# Patient Record
Sex: Female | Born: 1945 | Race: White | Hispanic: No | Smoking: Never smoker
Health system: Southern US, Community
[De-identification: ages and names within clinical notes are randomized; demographics above are authoritative.]

## PROBLEM LIST (undated history)

## (undated) DIAGNOSIS — K219 Gastro-esophageal reflux disease without esophagitis: Secondary | ICD-10-CM

## (undated) DIAGNOSIS — C50919 Malignant neoplasm of unspecified site of unspecified female breast: Secondary | ICD-10-CM

## (undated) DIAGNOSIS — Z923 Personal history of irradiation: Secondary | ICD-10-CM

## (undated) DIAGNOSIS — Z803 Family history of malignant neoplasm of breast: Secondary | ICD-10-CM

## (undated) DIAGNOSIS — K760 Fatty (change of) liver, not elsewhere classified: Secondary | ICD-10-CM

## (undated) DIAGNOSIS — Z9221 Personal history of antineoplastic chemotherapy: Secondary | ICD-10-CM

## (undated) DIAGNOSIS — K7460 Unspecified cirrhosis of liver: Secondary | ICD-10-CM

## (undated) DIAGNOSIS — M1990 Unspecified osteoarthritis, unspecified site: Secondary | ICD-10-CM

## (undated) DIAGNOSIS — K449 Diaphragmatic hernia without obstruction or gangrene: Secondary | ICD-10-CM

## (undated) HISTORY — PX: COLONOSCOPY: SHX174

## (undated) HISTORY — DX: Gastro-esophageal reflux disease without esophagitis: K21.9

## (undated) HISTORY — DX: Malignant neoplasm of unspecified site of unspecified female breast: C50.919

## (undated) HISTORY — DX: Unspecified osteoarthritis, unspecified site: M19.90

## (undated) HISTORY — DX: Fatty (change of) liver, not elsewhere classified: K76.0

## (undated) HISTORY — PX: BREAST LUMPECTOMY: SHX2

## (undated) HISTORY — DX: Unspecified cirrhosis of liver: K74.60

## (undated) HISTORY — PX: BREAST SURGERY: SHX581

## (undated) HISTORY — DX: Diaphragmatic hernia without obstruction or gangrene: K44.9

## (undated) HISTORY — DX: Family history of malignant neoplasm of breast: Z80.3

---

## 1975-04-13 HISTORY — PX: LAPAROSCOPY: SHX197

## 1994-04-12 HISTORY — PX: UPPER GASTROINTESTINAL ENDOSCOPY: SHX188

## 2009-12-31 ENCOUNTER — Encounter: Admission: RE | Admit: 2009-12-31 | Discharge: 2009-12-31 | Payer: Self-pay | Admitting: Surgery

## 2010-01-16 ENCOUNTER — Ambulatory Visit (HOSPITAL_COMMUNITY): Admission: RE | Admit: 2010-01-16 | Discharge: 2010-01-16 | Payer: Self-pay | Admitting: Surgery

## 2010-01-16 ENCOUNTER — Encounter: Admission: RE | Admit: 2010-01-16 | Discharge: 2010-01-16 | Payer: Self-pay | Admitting: Surgery

## 2010-02-18 ENCOUNTER — Ambulatory Visit: Payer: Self-pay | Admitting: Cardiology

## 2010-02-23 ENCOUNTER — Ambulatory Visit (HOSPITAL_BASED_OUTPATIENT_CLINIC_OR_DEPARTMENT_OTHER): Admission: RE | Admit: 2010-02-23 | Discharge: 2010-02-23 | Payer: Self-pay | Admitting: Surgery

## 2010-06-23 LAB — APTT: aPTT: 32 seconds (ref 24–37)

## 2010-06-23 LAB — CBC
MCH: 30.9 pg (ref 26.0–34.0)
MCV: 90.3 fL (ref 78.0–100.0)
Platelets: 276 10*3/uL (ref 150–400)
RDW: 12.9 % (ref 11.5–15.5)
WBC: 6 10*3/uL (ref 4.0–10.5)

## 2010-06-25 LAB — CBC
HCT: 39.1 % (ref 36.0–46.0)
MCHC: 33.8 g/dL (ref 30.0–36.0)
MCV: 90.3 fL (ref 78.0–100.0)
RDW: 13 % (ref 11.5–15.5)

## 2010-09-28 LAB — BASIC METABOLIC PANEL
Creatinine: 1 mg/dL (ref 0.5–1.1)
Glucose: 84 mg/dL
Potassium: 5 mmol/L (ref 3.4–5.3)
Sodium: 137 mmol/L (ref 137–147)

## 2010-09-28 LAB — HEPATIC FUNCTION PANEL: Bilirubin, Total: 0.9 mg/dL

## 2010-10-30 ENCOUNTER — Encounter (INDEPENDENT_AMBULATORY_CARE_PROVIDER_SITE_OTHER): Payer: Self-pay | Admitting: Surgery

## 2010-11-02 ENCOUNTER — Encounter (INDEPENDENT_AMBULATORY_CARE_PROVIDER_SITE_OTHER): Payer: Self-pay | Admitting: Surgery

## 2010-11-04 ENCOUNTER — Encounter (INDEPENDENT_AMBULATORY_CARE_PROVIDER_SITE_OTHER): Payer: Self-pay | Admitting: Surgery

## 2010-11-04 ENCOUNTER — Ambulatory Visit (INDEPENDENT_AMBULATORY_CARE_PROVIDER_SITE_OTHER): Payer: BC Managed Care – PPO | Admitting: Surgery

## 2010-11-04 VITALS — Temp 98.1°F | Wt 172.0 lb

## 2010-11-04 DIAGNOSIS — Z853 Personal history of malignant neoplasm of breast: Secondary | ICD-10-CM

## 2010-11-04 NOTE — Progress Notes (Signed)
This patient had her port removed on 5 712. The incision is well healed with no sign of infection. The scar is healing well. She continues to follow with Dr. Cleone Slim on a regular basis. At this point the patient prefers to have her long-term follow up with Dr. Cleone Slim in [facility] rather than driving all the way to [facility]. I think that this would be fine.  Recently the patient had a set of liver function tests which were abnormal. Imaging shows findings possibly consistent with cirrhosis. She is scheduled to see a GI doctor in September.  We will see the patient back on a p.r.n. basis.

## 2010-11-18 ENCOUNTER — Encounter (INDEPENDENT_AMBULATORY_CARE_PROVIDER_SITE_OTHER): Payer: Self-pay

## 2010-11-24 ENCOUNTER — Other Ambulatory Visit: Payer: Self-pay | Admitting: Hematology and Oncology

## 2010-11-24 DIAGNOSIS — Z853 Personal history of malignant neoplasm of breast: Secondary | ICD-10-CM

## 2010-12-22 ENCOUNTER — Ambulatory Visit (INDEPENDENT_AMBULATORY_CARE_PROVIDER_SITE_OTHER): Payer: Self-pay | Admitting: Internal Medicine

## 2010-12-28 ENCOUNTER — Encounter (INDEPENDENT_AMBULATORY_CARE_PROVIDER_SITE_OTHER): Payer: Self-pay | Admitting: Internal Medicine

## 2010-12-28 ENCOUNTER — Ambulatory Visit (INDEPENDENT_AMBULATORY_CARE_PROVIDER_SITE_OTHER): Payer: BC Managed Care – PPO | Admitting: Internal Medicine

## 2010-12-28 ENCOUNTER — Other Ambulatory Visit (INDEPENDENT_AMBULATORY_CARE_PROVIDER_SITE_OTHER): Payer: Self-pay | Admitting: Internal Medicine

## 2010-12-28 VITALS — BP 102/66 | HR 68 | Temp 97.0°F | Ht 66.0 in | Wt 169.0 lb

## 2010-12-28 DIAGNOSIS — M1711 Unilateral primary osteoarthritis, right knee: Secondary | ICD-10-CM

## 2010-12-28 DIAGNOSIS — R748 Abnormal levels of other serum enzymes: Secondary | ICD-10-CM

## 2010-12-28 DIAGNOSIS — K219 Gastro-esophageal reflux disease without esophagitis: Secondary | ICD-10-CM

## 2010-12-28 DIAGNOSIS — Z86010 Personal history of colonic polyps: Secondary | ICD-10-CM

## 2010-12-28 NOTE — Progress Notes (Signed)
Job [id number]

## 2010-12-28 NOTE — Patient Instructions (Addendum)
Please contact Dr.Padhan to get Hep A and B vaccination. Do not eat raw fish. Physician will contact you with results of blood test when available.

## 2010-12-30 LAB — HEPATITIS B SURFACE ANTIGEN: Hepatitis B Surface Ag: NEGATIVE

## 2010-12-31 NOTE — Progress Notes (Signed)
REASON FOR CONSULTATION:  Elevated transaminases.  Imaging studies revealed fatty liver and question early cirrhosis.  HISTORY OF PRESENT ILLNESS:  Kaitlyn Freeman is a 65 year old Caucasian female who is referred through courtesy Dr. Isabel Caprice for GI evaluation.  She has history of adenocarcinoma of left breast for which she had a lumpectomy in October last year, along with adjuvant chemotherapy and radiation therapy.  She was treated with Adriamycin and Cytoxan initially followed by 8 cycles of paclitaxel which was discontinued because she apparently developed allergic reaction.  She has remained in remission.   Back in June this year, she was noted to have elevated transaminases.  On September 28, 2010, her alkaline phosphatase was 289 (32-92) close AST was 101.  ALT was 88.  Her platelet count was normal at 194.  She had a hepatobiliary ultrasound which revealed fatty liver but no focal abnormalities were noted.  In July, she had serum ferritin, sed rate, ANA, SMA water markers and all of these were negative and there were reviewed below.  I actually had talked with Dr. Cleone Slim before these studies were ordered.  She went on to have MRI of her liver on October 16, 2010.  This study was performed without contrast.  It revealed subtle heterogeneity due to liver.  No focal abnormalities were noted.  However, liver pattern was described as "lazy."  In addition, there was irregular hepatic contour and relative enlargement of left segment of left lobe and equivocal atrophy of the medial segment.  The portal and hepatic veins were patent.  There was no splenomegaly.   The patient denies prior history of jaundice or icteric hepatitis.  She has never received blood transfusion or tattoos.  There is no history of IV drug use.  She has never been vaccinated for hepatitis A or B.  She says her appetite is fair.  It has not been as good as it used to be.  Since her diagnosis of breast carcinoma, she has lost 30 pounds.  She states she  has noted some pruritus primarily over her abdomen but she states she has been reading too much.  She denies nausea, vomiting, melena or rectal bleeding.  Her bowels move daily.  She denies feeling weak or tight.   Current medications include Zyrtec chewable 10 mg daily p.r.n., Restasis 0.05% ophthalmic emulsion 1 drop twice daily, Benadryl 25 mg daily p.r.n., magnesium hydroxide 810 mg daily p.r.n., Aleve daily p.r.n. which is not often, MiraLax 17 g daily p.r.n., ranitidine 150 mg twice daily, sodium chloride 0.65% nasal spray p.r.n.  PAST MEDICAL HISTORY:  She has chronic GERD, right knee arthritis, history of colonic polyps.  She has had to colonoscopies today.  She had a 1 or 2 polyps found on her first exam, but her last exam was 5 years ago, and was unremarkable.  History of adenocarcinoma of the left breast which is detailed above.  She had laparoscopy in 1977.  ALLERGIES:  SPORANOX.  FAMILY HISTORY:  Father was diagnosed with lung carcinoma at age 23 and died at age 16.  Mother died of CHF at age 10.  She has one brother living who is in good health and two sisters, one as osteoarthrosis another one is in good health.  SOCIAL HISTORY:  She is widowed.  She lost her husband the secondary to ALS at age 82.  She has two sons and both of them are accompanying her today.  She has never smoked cigarettes and drinks alcohol occasionally.  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  PHYSICAL EXAMINATION:   VITAL SIGNS:  She weighs 169 pounds.  She is 66 inches tall.  Pulse 68 per minute, blood pressure 102/66, temperature 97, respirations 20.  EYES:  Conjunctivae are pink.  Sclerae are nonicteric.  Oropharyngeal mucosa is normal.  oropharyngeal mucosa is normal.  Dentition in satisfactory condition.  NECK:  No neck masses or thyromegaly noted.  Cardiac exam with regular rhythm.  Normal S1-S2.  No murmur or gallop noted.  LUNGS:  Clear to auscultation.  She does not have any spider  angiomata.  ABDOMEN:  Symmetrical.  Bowel sounds are normal.  No bruits noted on palpation.  Soft abdomen without tenderness or masses.  Spleen edge is not palpable.  Liver edge is indistinct below RCM about 2 cm and inspiration span is 13-14 cm.  EXTREMITIES:  No peripheral edema, clubbing, or skin rashes noted.  LABORATORY DATA:  Lab data from September 28, 2010, as above.   The lab data from November 24, 2010, total bilirubin 1.00, AP 308, AST is 97, ALT is 90.  Electrolytes are normal.  Glucose 92, BUN 20, creatinine 1.17.  Calcium is 9.3.  Albumin is 3.6.   WBC 3.8, H and H is 11.6 and 34.6, platelet count is 218 K.   Her ultrasound and MRI films are not accessible for review at the time of this dictation.   On October 19, 2010, she had a ferritin of 1.4.  Sed rate of 46.  ANA negative.  SMA 23 which is mildly elevated (normal 0 to 19).  Hepatitis C antibody negative, hepatitis B surface antibody is negative.  I do not see hepatitis B surface antigen.  ASSESSMENT:  Kaitlyn Freeman is a very pleasant 65 year old Caucasian female with history of left breast carcinoma with lumpectomy followed by chemo and radiation.  Her surgery was in October last year followed by chemotherapy as reviewed above and radiation therapy between May and July.  In June, she was noted to have elevated transaminases and mildly elevated alkaline phosphatase.  Ultrasound suggested fatty liver followed by MRI which was also abnormal.  There is no prior history of liver disease.  Family history is also negative.   Unfortunately, she has not had any prior imaging studies that could be used as a baseline.   While I do not have the provision of examining her imaging studies.  I am concerned that she may have early cirrhosis given irregular contour.  I would review the imaging studies with the help of radiologist at [facility].   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  Hepatic injury has been  described with Cytoxan as well as paclitaxel and even with Adriamycin.  Her sed rate is mildly elevated and SMA is also mildly elevated.  Therefore autoimmune liver disease remains in differential diagnosis, however, she does not have any history of autoimmune disorder, and I am not convinced that that is the reason for liver disease.  I doubt that she has a primary nonalcoholic steatohepatitis.  RECOMMENDATIONS:  We will check her antimicrobial and we will antibody.  Unless she has had hepatitis B surface antigen.  We will also check this for the record.   The patient should consider having hepatitis A and B vaccination, and she will contact Dr. Trude Mcburney office, and we will also forward him this note.   I believe, she will need liver biopsy both to determine the stage of her liver  disease and hoping to make an accurate diagnosis, but final decision will be delayed until the serologic workup has been completed.   I did talk with both of her sons Judie Grieve and Lorin Picket and answered all of their questions.   I also asked Manha not to eat raw fish, but she should carry on with her lifestyle as usual.   We appreciate the opportunity to participate in the care of this nice lady.    ______________________________ Lionel December, M.D.

## 2011-01-01 ENCOUNTER — Other Ambulatory Visit (INDEPENDENT_AMBULATORY_CARE_PROVIDER_SITE_OTHER): Payer: Self-pay | Admitting: Internal Medicine

## 2011-01-01 DIAGNOSIS — R7989 Other specified abnormal findings of blood chemistry: Secondary | ICD-10-CM

## 2011-01-01 DIAGNOSIS — IMO0002 Reserved for concepts with insufficient information to code with codable children: Secondary | ICD-10-CM

## 2011-01-08 ENCOUNTER — Other Ambulatory Visit: Payer: Self-pay | Admitting: Interventional Radiology

## 2011-01-08 ENCOUNTER — Ambulatory Visit (HOSPITAL_COMMUNITY)
Admission: RE | Admit: 2011-01-08 | Discharge: 2011-01-08 | Disposition: A | Discharge status: Home / Self Care | Payer: BC Managed Care – PPO | Source: Ambulatory Visit | Attending: Internal Medicine | Admitting: Internal Medicine

## 2011-01-08 DIAGNOSIS — Z853 Personal history of malignant neoplasm of breast: Secondary | ICD-10-CM | POA: Insufficient documentation

## 2011-01-08 LAB — PROTIME-INR
INR: 0.92 (ref 0.00–1.49)
Prothrombin Time: 12.6 seconds (ref 11.6–15.2)

## 2011-01-08 LAB — CBC
Hemoglobin: 12.3 g/dL (ref 12.0–15.0)
MCH: 31.9 pg (ref 26.0–34.0)
MCHC: 34.9 g/dL (ref 30.0–36.0)
Platelets: 225 10*3/uL (ref 150–400)
RDW: 14 % (ref 11.5–15.5)

## 2011-01-12 DIAGNOSIS — K219 Gastro-esophageal reflux disease without esophagitis: Secondary | ICD-10-CM | POA: Insufficient documentation

## 2011-01-12 DIAGNOSIS — Z860100 Personal history of colon polyps, unspecified: Secondary | ICD-10-CM | POA: Insufficient documentation

## 2011-01-12 DIAGNOSIS — M1711 Unilateral primary osteoarthritis, right knee: Secondary | ICD-10-CM | POA: Insufficient documentation

## 2011-01-13 ENCOUNTER — Other Ambulatory Visit (INDEPENDENT_AMBULATORY_CARE_PROVIDER_SITE_OTHER): Payer: Self-pay | Admitting: Internal Medicine

## 2011-01-13 DIAGNOSIS — K743 Primary biliary cirrhosis: Secondary | ICD-10-CM

## 2011-01-13 MED ORDER — URSODIOL 250 MG PO TABS: 500.0000 mg | ORAL_TABLET | Freq: Three times a day (TID) | ORAL | Status: DC

## 2011-01-14 ENCOUNTER — Encounter (INDEPENDENT_AMBULATORY_CARE_PROVIDER_SITE_OTHER): Payer: Self-pay | Admitting: *Deleted

## 2011-01-14 ENCOUNTER — Telehealth (INDEPENDENT_AMBULATORY_CARE_PROVIDER_SITE_OTHER): Payer: Self-pay | Admitting: *Deleted

## 2011-01-14 DIAGNOSIS — K743 Primary biliary cirrhosis: Secondary | ICD-10-CM

## 2011-01-14 NOTE — Telephone Encounter (Signed)
Patient has questions about her medication.  She thought Dr. Karilyn Cota said she would take 1 -3 times daily but what she picked up was 2 -3 times daily.  At work until [phone]

## 2011-01-14 NOTE — Telephone Encounter (Signed)
Per Dr. Karilyn Cota the patient will need Hepatic Profile 1 month prior to office visit. Patient need appointment in November 2012. Forwarded to [place] to address.

## 2011-01-14 NOTE — Telephone Encounter (Signed)
appt 03/01/11 @ 2:45, letter mailed to patient

## 2011-01-15 NOTE — Telephone Encounter (Signed)
Per Dr. Karilyn Cota the patient is to take Urso 500 mg TID. I called the patient and made her aware.

## 2011-01-18 ENCOUNTER — Telehealth (INDEPENDENT_AMBULATORY_CARE_PROVIDER_SITE_OTHER): Payer: Self-pay | Admitting: *Deleted

## 2011-01-18 NOTE — Telephone Encounter (Signed)
Patient was called in Urso 500 mg 1 three times a [facility] to [facility] [place].  They did not have the 500 mg but had the 250 mg but they would not change the quantity--90 was called in and she needed #180 or she will be calling sooner than she should to have refilled.  They could not change the quantity but could change 500 to 250 mg.    Since we need to call them anyway, her insurance for RX meds will pay for 3 months with the 3 additional refills.  Just make sure they know they quantity has changed since they do not carry the 500 mg.  She will be running out 10/19-20

## 2011-01-24 ENCOUNTER — Encounter (INDEPENDENT_AMBULATORY_CARE_PROVIDER_SITE_OTHER): Payer: Self-pay | Admitting: Internal Medicine

## 2011-01-25 ENCOUNTER — Encounter (INDEPENDENT_AMBULATORY_CARE_PROVIDER_SITE_OTHER): Payer: Self-pay | Admitting: Internal Medicine

## 2011-01-28 NOTE — Telephone Encounter (Signed)
[facility] [place] [facility] [address], I updated the patient's prescription as follows: Urso 500 mg - Take 1 by mouth three times a day- 3 month supply with 3 additional refills. I was given permission from Delrae Rend to do so. Patient was called and made aware.

## 2011-01-29 ENCOUNTER — Encounter (INDEPENDENT_AMBULATORY_CARE_PROVIDER_SITE_OTHER): Payer: Self-pay | Admitting: Internal Medicine

## 2011-02-01 ENCOUNTER — Encounter (INDEPENDENT_AMBULATORY_CARE_PROVIDER_SITE_OTHER): Payer: Self-pay | Admitting: Internal Medicine

## 2011-02-17 ENCOUNTER — Encounter (INDEPENDENT_AMBULATORY_CARE_PROVIDER_SITE_OTHER): Payer: Self-pay | Admitting: *Deleted

## 2011-02-22 ENCOUNTER — Encounter (INDEPENDENT_AMBULATORY_CARE_PROVIDER_SITE_OTHER): Payer: Self-pay | Admitting: Internal Medicine

## 2011-02-23 ENCOUNTER — Telehealth (INDEPENDENT_AMBULATORY_CARE_PROVIDER_SITE_OTHER): Payer: Self-pay | Admitting: Internal Medicine

## 2011-02-23 NOTE — Telephone Encounter (Signed)
Patient is going to have her labs drawn on 02/1611 at [facility] here. Her apt is next week. Please fax the lab order.

## 2011-02-24 NOTE — Telephone Encounter (Signed)
Lab order resent to [facility] upon patient request.

## 2011-02-26 ENCOUNTER — Other Ambulatory Visit (INDEPENDENT_AMBULATORY_CARE_PROVIDER_SITE_OTHER): Payer: Self-pay | Admitting: Internal Medicine

## 2011-02-27 LAB — HEPATIC FUNCTION PANEL
ALT: 18 U/L (ref 0–35)
AST: 26 U/L (ref 0–37)
Albumin: 3.9 g/dL (ref 3.5–5.2)
Alkaline Phosphatase: 143 U/L — ABNORMAL HIGH (ref 39–117)
Indirect Bilirubin: 0.4 mg/dL (ref 0.0–0.9)
Total Protein: 7.3 g/dL (ref 6.0–8.3)

## 2011-03-01 ENCOUNTER — Encounter (INDEPENDENT_AMBULATORY_CARE_PROVIDER_SITE_OTHER): Payer: Self-pay | Admitting: Internal Medicine

## 2011-03-01 ENCOUNTER — Ambulatory Visit (INDEPENDENT_AMBULATORY_CARE_PROVIDER_SITE_OTHER): Payer: BC Managed Care – PPO | Admitting: Internal Medicine

## 2011-03-01 VITALS — BP 100/70 | HR 68 | Temp 98.3°F | Resp 12 | Ht 66.0 in | Wt 165.0 lb

## 2011-03-01 DIAGNOSIS — K743 Primary biliary cirrhosis: Secondary | ICD-10-CM

## 2011-03-01 DIAGNOSIS — K745 Biliary cirrhosis, unspecified: Secondary | ICD-10-CM

## 2011-03-01 NOTE — Progress Notes (Signed)
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 Subjective: Patient is 65 year old Caucasian female was seen about 2 months ago for evaluation of elevated transaminases. Workup pertinent for fatty liver on ultrasound and a positive AMA. Liver biopsy on 01/08/2011 reveals typical changes and stage II disease. 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 He was begun on are so on 01/14/2011. She is having no side effects with this therapy. She is receiving vaccination for hepatitis A and B. by Dr.Pradhan to receive his third dose in 4 months or so. She denies abdominal pain, pruritus, nausea, vomiting diarrhea or constipation. Current Medications: Current Outpatient Prescriptions  Medication Sig Dispense Refill  . calcium carbonate 200 MG capsule Take 500 mg by mouth 3 (three) times daily.       . cetirizine (ZYRTEC) 10 MG chewable tablet Chew 10 mg by mouth as needed.       . cycloSPORINE (RESTASIS) 0.05 % ophthalmic emulsion 1 drop 2 (two) times daily.        . diphenhydrAMINE (BENADRYL) 25 MG tablet Take 25 mg by mouth. Patient takes 1-2 at Night      . magnesium hydroxide (MILK OF MAGNESIA) 800 MG/5ML suspension Take by mouth daily as needed.        . multivitamin-iron-minerals-folic acid (CENTRUM) chewable tablet Chew 1 tablet by mouth daily.        . naproxen (NAPROSYN) 500 MG tablet Take 500 mg by mouth as needed.        . polyethylene glycol (MIRALAX / GLYCOLAX) packet Take 17 g by mouth as needed.       . ranitidine (ZANTAC) 150 MG capsule Take 150 mg by mouth 2 (two) times daily.       . sodium chloride (OCEAN) 0.65 % nasal spray Place 1 spray into the nose as needed.        . ursodiol (ACTIGALL) 250 MG tablet Take 2 tablets (500 mg total) by mouth 3 (three) times daily.  90 tablet  11    Objective: BP 100/70  Pulse 68  Temp(Src) 98.3 F (36.8 C) (Oral)  Resp 12  Ht 5\' 6"  (1.676 m)  Wt 165 lb (74.844 kg)  BMI 26.63 kg/m2   Conjunctiva  is pink. Sclerae nonicteric. No neck masses or thyromegaly noted. Abdomen is soft and nontender without organomegaly or masses.  No LE edema or clubbing noted.   Labs/studies Results: LFTs from 11/24/2010 Bilirubin 1.0 AP 308, AST 97, ALT 90, albumin 3.6. LFTs from 02/26/2011. Bilirubin oh 0.5, AP 143, AST 26, ALT 18, albumin 3.9.    Assessment: Stage II AMA positive PBC. Patient's transaminases are now normal and alkaline phosphatase have also decreased dramatically. Patient is not having any side effects with therapy. Recent bone density normal per patient's report.   Plan: Continue Urso at 500 mg 3 times a day. New prescription given for 3 months with 3 refills. LFTs will be repeated in 3 months. She may consider colonoscopy next year(history of polyps). Office visit in one year.

## 2011-03-01 NOTE — Patient Instructions (Signed)
Blood work(LFTs) in 3 months.

## 2011-03-02 ENCOUNTER — Telehealth (INDEPENDENT_AMBULATORY_CARE_PROVIDER_SITE_OTHER): Payer: Self-pay | Admitting: *Deleted

## 2011-03-02 DIAGNOSIS — K743 Primary biliary cirrhosis: Secondary | ICD-10-CM

## 2011-03-02 NOTE — Telephone Encounter (Signed)
Per Dr. Karilyn Cota the patient will need LFT in 3 months.

## 2011-03-03 NOTE — Telephone Encounter (Signed)
Reviewed

## 2011-03-15 ENCOUNTER — Ambulatory Visit
Admission: RE | Admit: 2011-03-15 | Discharge: 2011-03-15 | Disposition: A | Discharge status: Home / Self Care | Payer: BC Managed Care – PPO | Source: Ambulatory Visit | Attending: Hematology and Oncology | Admitting: Hematology and Oncology

## 2011-03-15 DIAGNOSIS — Z853 Personal history of malignant neoplasm of breast: Secondary | ICD-10-CM

## 2011-05-19 ENCOUNTER — Telehealth (INDEPENDENT_AMBULATORY_CARE_PROVIDER_SITE_OTHER): Payer: Self-pay | Admitting: *Deleted

## 2011-05-19 ENCOUNTER — Encounter (INDEPENDENT_AMBULATORY_CARE_PROVIDER_SITE_OTHER): Payer: Self-pay | Admitting: *Deleted

## 2011-05-19 DIAGNOSIS — K743 Primary biliary cirrhosis: Secondary | ICD-10-CM

## 2011-05-19 NOTE — Telephone Encounter (Signed)
Lab faxed to [facility]. Patient sent a letter

## 2011-06-03 LAB — HEPATIC FUNCTION PANEL
ALT: 20 U/L (ref 0–35)
Albumin: 4.1 g/dL (ref 3.5–5.2)
Total Protein: 7.2 g/dL (ref 6.0–8.3)

## 2011-06-04 ENCOUNTER — Telehealth (INDEPENDENT_AMBULATORY_CARE_PROVIDER_SITE_OTHER): Payer: Self-pay | Admitting: *Deleted

## 2011-06-04 NOTE — Telephone Encounter (Signed)
Per Dr. Karilyn Cota the patient will need LFT in 6 months with a office visit per Dr. Karilyn Cota. Labs are noted for 12-02-11 and will faxed to [facility]

## 2011-10-04 ENCOUNTER — Encounter: Payer: BC Managed Care – PPO | Admitting: Hematology and Oncology

## 2011-10-04 DIAGNOSIS — J8410 Pulmonary fibrosis, unspecified: Secondary | ICD-10-CM

## 2011-10-04 DIAGNOSIS — K219 Gastro-esophageal reflux disease without esophagitis: Secondary | ICD-10-CM

## 2011-10-04 DIAGNOSIS — C50919 Malignant neoplasm of unspecified site of unspecified female breast: Secondary | ICD-10-CM

## 2011-10-15 ENCOUNTER — Other Ambulatory Visit (HOSPITAL_COMMUNITY): Payer: Self-pay | Admitting: Internal Medicine

## 2011-10-15 DIAGNOSIS — Z853 Personal history of malignant neoplasm of breast: Secondary | ICD-10-CM

## 2011-11-10 ENCOUNTER — Other Ambulatory Visit (INDEPENDENT_AMBULATORY_CARE_PROVIDER_SITE_OTHER): Payer: Self-pay | Admitting: *Deleted

## 2011-11-10 ENCOUNTER — Encounter (INDEPENDENT_AMBULATORY_CARE_PROVIDER_SITE_OTHER): Payer: Self-pay | Admitting: *Deleted

## 2011-12-03 LAB — HEPATIC FUNCTION PANEL
ALT: 24 U/L (ref 0–35)
Bilirubin, Direct: 0.1 mg/dL (ref 0.0–0.3)
Indirect Bilirubin: 0.5 mg/dL (ref 0.0–0.9)
Total Bilirubin: 0.6 mg/dL (ref 0.3–1.2)

## 2011-12-06 ENCOUNTER — Ambulatory Visit (INDEPENDENT_AMBULATORY_CARE_PROVIDER_SITE_OTHER): Payer: BC Managed Care – PPO | Admitting: Internal Medicine

## 2011-12-06 ENCOUNTER — Encounter (INDEPENDENT_AMBULATORY_CARE_PROVIDER_SITE_OTHER): Payer: Self-pay | Admitting: Internal Medicine

## 2011-12-06 VITALS — BP 102/68 | HR 70 | Temp 97.4°F | Resp 18 | Ht 66.0 in | Wt 172.4 lb

## 2011-12-06 DIAGNOSIS — K745 Biliary cirrhosis, unspecified: Secondary | ICD-10-CM

## 2011-12-06 DIAGNOSIS — K743 Primary biliary cirrhosis: Secondary | ICD-10-CM | POA: Insufficient documentation

## 2011-12-06 MED ORDER — URSODIOL 250 MG PO TABS: 500.0000 mg | ORAL_TABLET | Freq: Three times a day (TID) | ORAL | Status: DC

## 2011-12-06 NOTE — Progress Notes (Signed)
Presenting complaint;  Followup for PBC.  Subjective:  Patient is 66 year old Caucasian female who was diagnosed with PBC stage II or early 3 in September 2012. She has been maintained on Urso. She was last seen in November 2012. She has very good appetite. She has gained 7 pounds since her last visit. She denies abdominal pain pruritus fatigue weakness nausea or vomiting. She had normal bone density study within the last year. She has sporadic heartburn with certain foods easily controlled with when necessary use of Zantac. She is having no side effects with Urso.  Current Medications: Current Outpatient Prescriptions  Medication Sig Dispense Refill  . B Complex Vitamins (VITAMIN-B COMPLEX PO) Take by mouth daily.      . cetirizine (ZYRTEC) 10 MG chewable tablet Chew 10 mg by mouth as needed.       . cycloSPORINE (RESTASIS) 0.05 % ophthalmic emulsion 1 drop 2 (two) times daily.        . diphenhydrAMINE (BENADRYL) 25 MG tablet Take 25 mg by mouth. Patient takes 1-2 at Night      . Flaxseed, Linseed, OIL Take by mouth. Patient states that she takes this 3 times a week      . magnesium oxide (MAG-OX) 400 MG tablet Take 400 mg by mouth daily.      . multivitamin-iron-minerals-folic acid (CENTRUM) chewable tablet Chew 1 tablet by mouth daily.        . naproxen (NAPROSYN) 500 MG tablet Take 500 mg by mouth 2 (two) times daily as needed.       . polyethylene glycol (MIRALAX / GLYCOLAX) packet Take 17 g by mouth as needed.       . ranitidine (ZANTAC) 150 MG capsule Take 150 mg by mouth 2 (two) times daily as needed.       . sodium chloride (OCEAN) 0.65 % nasal spray Place 1 spray into the nose as needed.        . ursodiol (ACTIGALL) 250 MG tablet Take 2 tablets (500 mg total) by mouth 3 (three) times daily.  90 tablet  11     Objective: Blood pressure 102/68, pulse 70, temperature 97.4 F (36.3 C), temperature source Oral, resp. rate 18, height 5\' 6"  (1.676 m), weight 172 lb 6.4 oz (78.2  kg). Patient is alert and does not have asterixis. Conjunctiva is pink. Sclera is nonicteric Oropharyngeal mucosa is normal. No neck masses or thyromegaly noted. Cardiac exam with regular rhythm normal S1 and S2. No murmur or gallop noted. Lungs are clear to auscultation. Abdomen is symmetrical soft and nontender. Spleen is not palpable. Liver edge is indistinct below RCM. No LE edema or clubbing noted.  Labs/studies Results: LFTs from 12/03/2011. Bilirubin oh 0.6, AP 83, AST 26, ALT 24, total protein 7.2 and albumin 4.0.   Assessment:  #1. PBC. Condition was diagnosed in September 2012 she has stage II or early stage III disease. LFTs are entirely normal. #2. NSAID therapy. Potential side effects were reviewed with patient and she was advised to is the lowest dose that she did get by with.  Plan:  New prescription for Urso given for 3 months with 3 refills. LFTs in 6 months. Office visit in one year.

## 2011-12-06 NOTE — Patient Instructions (Addendum)
LFTs in 6 months(blood work). Prescription for Urso sent  to your pharmacy.

## 2011-12-07 ENCOUNTER — Telehealth (INDEPENDENT_AMBULATORY_CARE_PROVIDER_SITE_OTHER): Payer: Self-pay | Admitting: *Deleted

## 2011-12-07 DIAGNOSIS — K743 Primary biliary cirrhosis: Secondary | ICD-10-CM

## 2011-12-07 NOTE — Telephone Encounter (Signed)
Per Dr.Rehman the patient will need to have a LFT's in 6 months

## 2012-03-16 ENCOUNTER — Ambulatory Visit
Admission: RE | Admit: 2012-03-16 | Discharge: 2012-03-16 | Disposition: A | Discharge status: Home / Self Care | Payer: BC Managed Care – PPO | Source: Ambulatory Visit | Attending: Internal Medicine | Admitting: Internal Medicine

## 2012-03-16 DIAGNOSIS — Z853 Personal history of malignant neoplasm of breast: Secondary | ICD-10-CM

## 2012-03-23 ENCOUNTER — Encounter: Payer: BC Managed Care – PPO | Admitting: Internal Medicine

## 2012-03-23 DIAGNOSIS — C50919 Malignant neoplasm of unspecified site of unspecified female breast: Secondary | ICD-10-CM

## 2012-03-29 ENCOUNTER — Encounter (INDEPENDENT_AMBULATORY_CARE_PROVIDER_SITE_OTHER): Payer: Self-pay

## 2012-05-11 ENCOUNTER — Encounter (INDEPENDENT_AMBULATORY_CARE_PROVIDER_SITE_OTHER): Payer: Self-pay | Admitting: *Deleted

## 2012-05-11 ENCOUNTER — Telehealth (INDEPENDENT_AMBULATORY_CARE_PROVIDER_SITE_OTHER): Payer: Self-pay | Admitting: *Deleted

## 2012-05-11 DIAGNOSIS — K743 Primary biliary cirrhosis: Secondary | ICD-10-CM

## 2012-05-11 NOTE — Telephone Encounter (Signed)
Lab order printed

## 2012-06-01 ENCOUNTER — Encounter (INDEPENDENT_AMBULATORY_CARE_PROVIDER_SITE_OTHER): Payer: Self-pay

## 2012-08-05 IMAGING — US US BIOPSY
1 series · 12 of 12 positions shown · non-contrast
Comparison: none

Clinical Data/Indication: Elevated liver function tests and history
of breast cancer.

ULTRASOUND-GUIDED RANDOM LIVER CORE BIOPSY.
Sedation: Versed 2 mg, Fentanyl 100 mcg.
Total Moderate Sedation Time: 13 minutes.
Procedure: The procedure, risks, benefits, and alternatives were
explained to the patient. Questions regarding the procedure were
encouraged and answered. The patient understands and consents to
the procedure.
The right flank was prepped with betadine in a sterile fashion, and
a sterile drape was applied covering the operative field. A mask
and sterile gloves were used for the procedure.
Under sonographic guidance, a 17 gauge needle was inserted into the
right lobe of the liver.  Three 18 gauge core biopsies were
obtained.  The guide needle was removed. Final imaging was
performed.
Patient tolerated the procedure well without complication.  Vital
sign monitoring by nursing staff during the procedure will continue
as patient is in the special procedures unit for post procedure
observation.

[Series 1: us biopsy · 0.26mm/px · 12 of 12 slices shown]
[im 1/12]
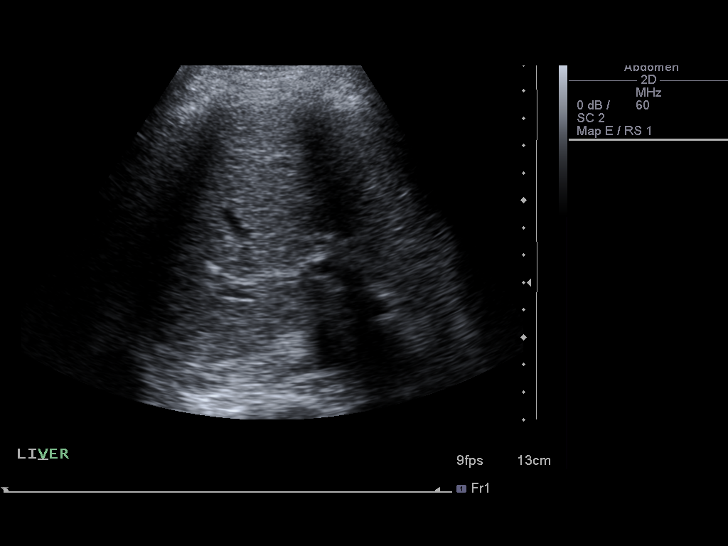
[im 2/12]
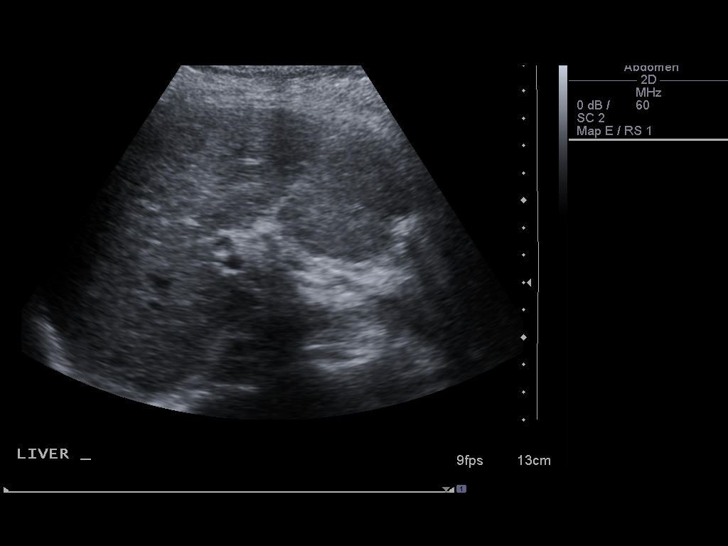
[im 3/12]
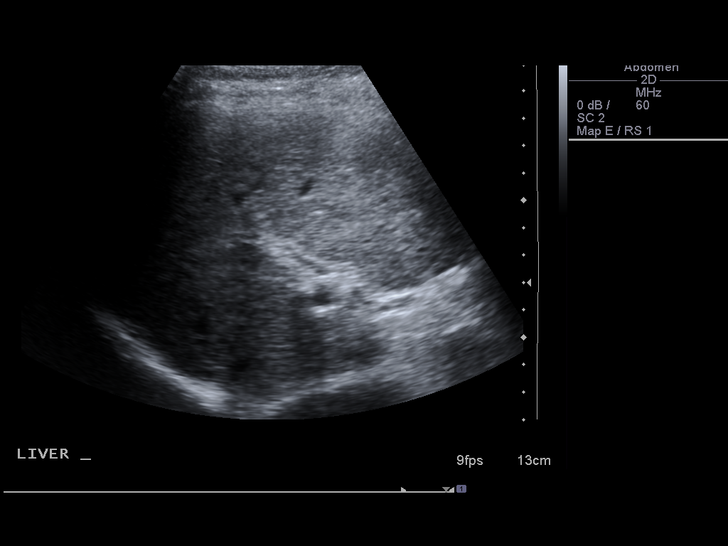
[im 4/12]
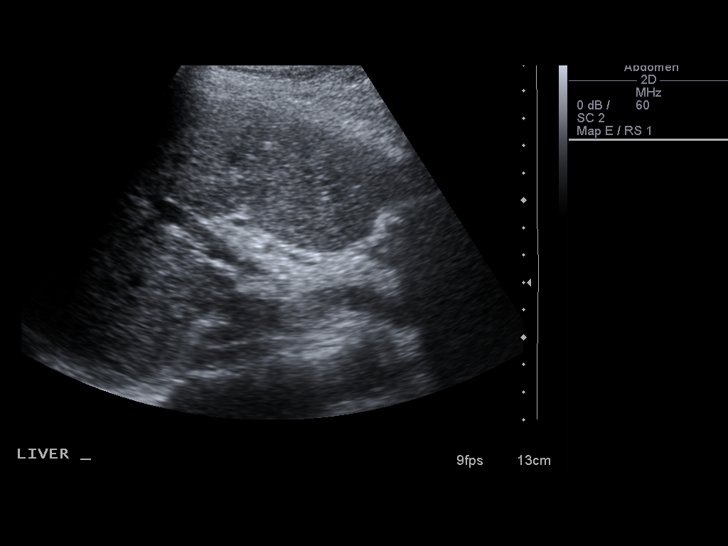
[im 5/12]
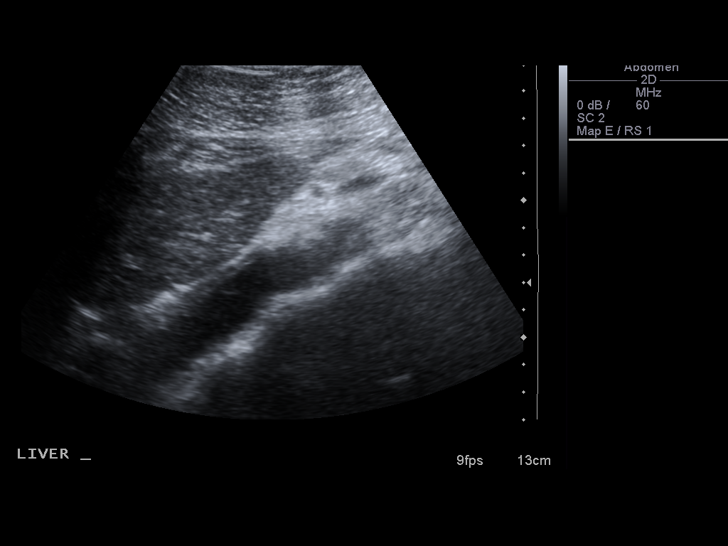
[im 6/12]
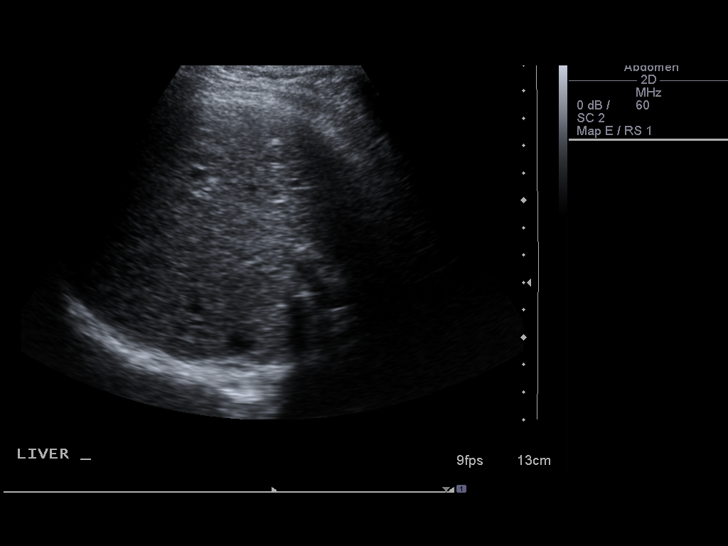
[im 7/12]
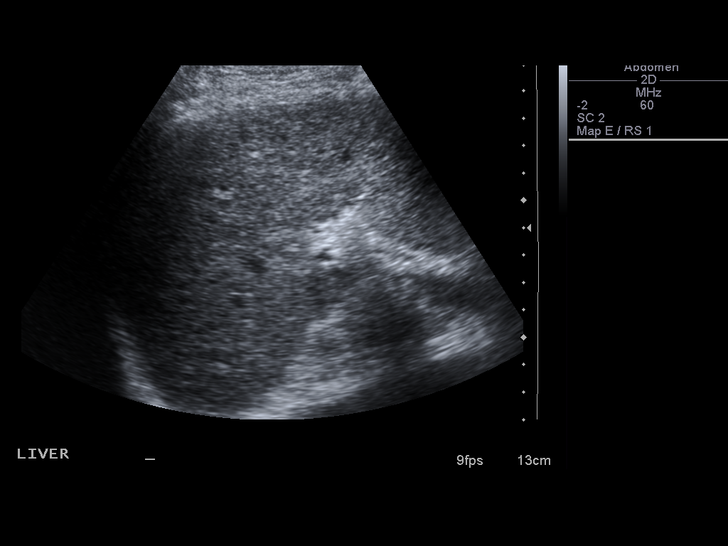
[im 8/12]
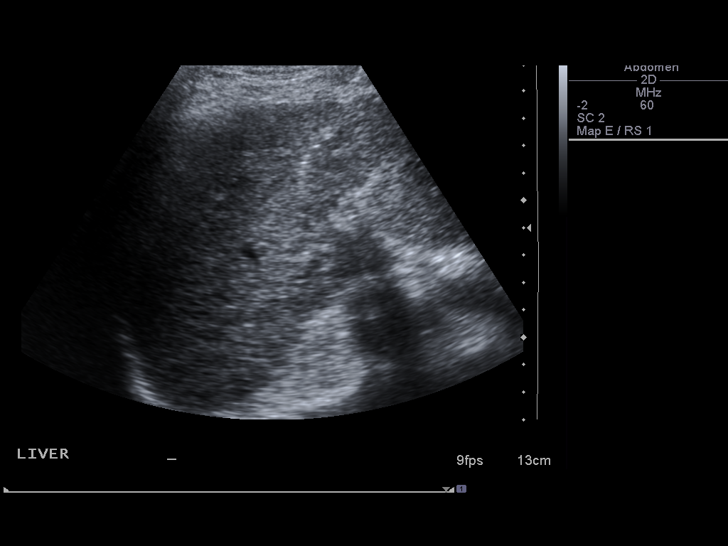
[im 9/12]
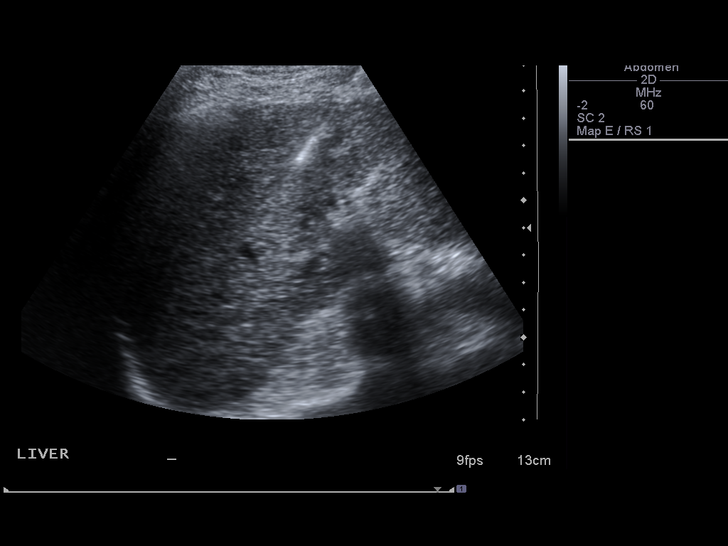
[im 10/12]
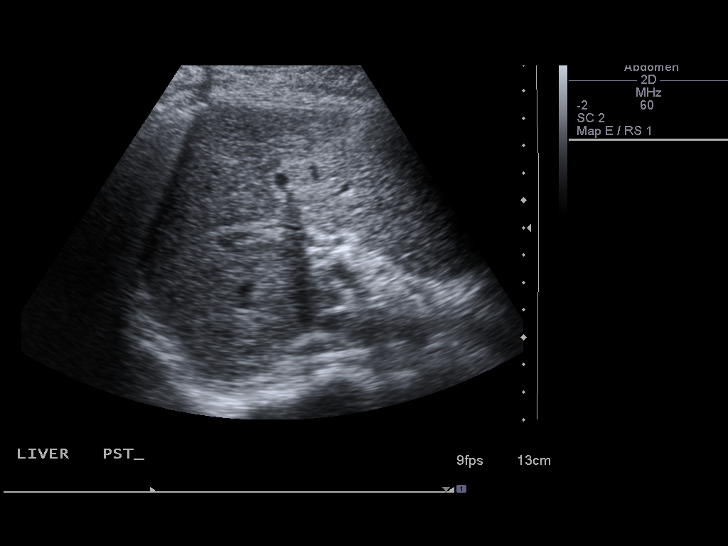
[im 11/12]
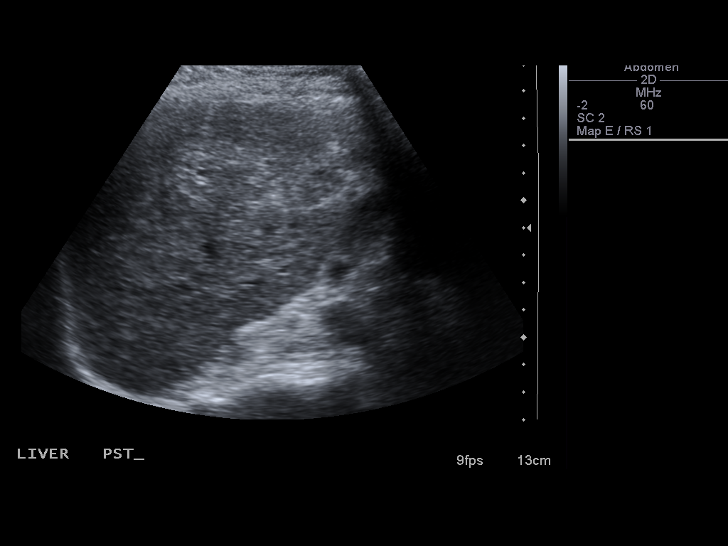
[im 12/12]
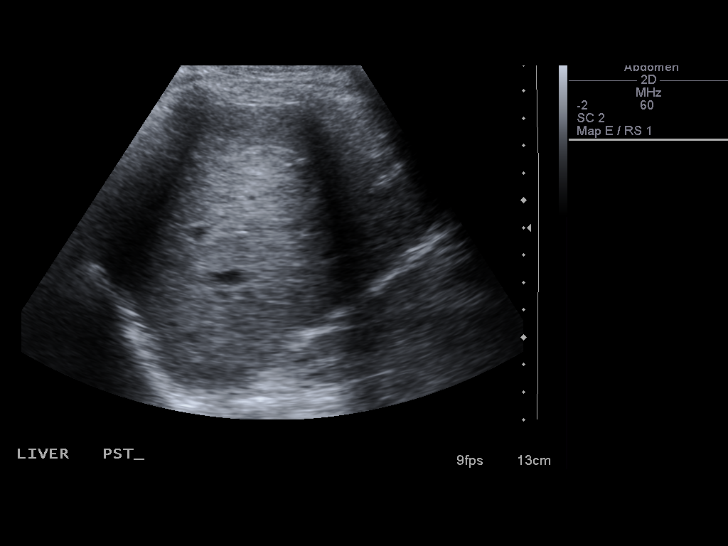

[12 of 12 positions shown; findings below may reference images not displayed]

FINDINGS: The images document guide needle placement within the
right lobe of the liver. Post biopsy images demonstrate no
hemorrhage.
IMPRESSION: Successful ultrasound-guided random liver core biopsy.

## 2012-09-08 ENCOUNTER — Telehealth (INDEPENDENT_AMBULATORY_CARE_PROVIDER_SITE_OTHER): Payer: Self-pay | Admitting: *Deleted

## 2012-09-08 NOTE — Telephone Encounter (Signed)
Veroncia would like for Tammy to return her call at [phone]. Has has a question about take another medicine with her current medications.

## 2012-09-11 NOTE — Telephone Encounter (Signed)
Patient will be called on 09/12/12.

## 2012-09-13 NOTE — Telephone Encounter (Signed)
Patient called and she ask if it would be ok for her to take Milk Thistle along with her Delma Freeze? Patient advised that Dr.Rehman would be ask and we would call her back with his recommendations.

## 2012-09-13 NOTE — Telephone Encounter (Signed)
Patient was called and made aware. 

## 2012-09-13 NOTE — Telephone Encounter (Signed)
Forwarded to Dr.Rehman. 

## 2012-09-13 NOTE — Telephone Encounter (Signed)
Okay for patient to take Urso. Please call  Patient.

## 2012-10-04 ENCOUNTER — Encounter (INDEPENDENT_AMBULATORY_CARE_PROVIDER_SITE_OTHER): Payer: BC Managed Care – PPO | Admitting: Internal Medicine

## 2012-10-04 DIAGNOSIS — C50919 Malignant neoplasm of unspecified site of unspecified female breast: Secondary | ICD-10-CM

## 2012-11-09 ENCOUNTER — Telehealth (INDEPENDENT_AMBULATORY_CARE_PROVIDER_SITE_OTHER): Payer: Self-pay | Admitting: *Deleted

## 2012-11-09 NOTE — Telephone Encounter (Signed)
Patient has called in and left a message on answering machine 10:01  She will be going on Medicare 1st of August and wants a refill on Urso.  Would like for you to call her about this.

## 2012-11-10 ENCOUNTER — Other Ambulatory Visit (INDEPENDENT_AMBULATORY_CARE_PROVIDER_SITE_OTHER): Payer: Self-pay | Admitting: Internal Medicine

## 2012-11-10 DIAGNOSIS — K743 Primary biliary cirrhosis: Secondary | ICD-10-CM

## 2012-11-10 MED ORDER — URSODIOL 250 MG PO TABS: 500.0000 mg | ORAL_TABLET | Freq: Three times a day (TID) | ORAL | Status: DC

## 2012-11-10 NOTE — Telephone Encounter (Signed)
Urso reordered. She should have an OV this month

## 2012-11-10 NOTE — Telephone Encounter (Signed)
Rx send to [facility] in [place].

## 2012-12-05 ENCOUNTER — Encounter (INDEPENDENT_AMBULATORY_CARE_PROVIDER_SITE_OTHER): Payer: Self-pay | Admitting: Internal Medicine

## 2012-12-05 ENCOUNTER — Ambulatory Visit (INDEPENDENT_AMBULATORY_CARE_PROVIDER_SITE_OTHER): Payer: Medicare Other | Admitting: Internal Medicine

## 2012-12-05 VITALS — BP 110/70 | HR 72 | Temp 97.4°F | Resp 16 | Ht 66.0 in | Wt 191.1 lb

## 2012-12-05 DIAGNOSIS — R21 Rash and other nonspecific skin eruption: Secondary | ICD-10-CM

## 2012-12-05 DIAGNOSIS — K743 Primary biliary cirrhosis: Secondary | ICD-10-CM

## 2012-12-05 DIAGNOSIS — K219 Gastro-esophageal reflux disease without esophagitis: Secondary | ICD-10-CM

## 2012-12-05 DIAGNOSIS — K745 Biliary cirrhosis, unspecified: Secondary | ICD-10-CM

## 2012-12-05 LAB — CBC WITH DIFFERENTIAL/PLATELET
Eosinophils Absolute: 0.3 10*3/uL (ref 0.0–0.7)
Eosinophils Relative: 6 % — ABNORMAL HIGH (ref 0–5)
Hemoglobin: 12.2 g/dL (ref 12.0–15.0)
Lymphocytes Relative: 16 % (ref 12–46)
Lymphs Abs: 0.9 10*3/uL (ref 0.7–4.0)
MCH: 31 pg (ref 26.0–34.0)
MCV: 91.1 fL (ref 78.0–100.0)
Monocytes Relative: 7 % (ref 3–12)
Neutrophils Relative %: 71 % (ref 43–77)
Platelets: 236 10*3/uL (ref 150–400)
RBC: 3.94 MIL/uL (ref 3.87–5.11)
WBC: 5.8 10*3/uL (ref 4.0–10.5)

## 2012-12-05 MED ORDER — FLUOCINONIDE-E 0.05 % EX CREA: TOPICAL_CREAM | Freq: Two times a day (BID) | CUTANEOUS | Status: DC

## 2012-12-05 MED ORDER — RANITIDINE HCL 150 MG PO CAPS: 150.0000 mg | ORAL_CAPSULE | Freq: Two times a day (BID) | ORAL | Status: AC

## 2012-12-05 MED ORDER — URSODIOL 250 MG PO TABS: 500.0000 mg | ORAL_TABLET | Freq: Three times a day (TID) | ORAL | Status: AC

## 2012-12-05 MED ORDER — URSODIOL 250 MG PO TABS: 500.0000 mg | ORAL_TABLET | Freq: Three times a day (TID) | ORAL | Status: DC

## 2012-12-05 NOTE — Patient Instructions (Signed)
Physician will contact her with results of blood work. 

## 2012-12-05 NOTE — Progress Notes (Signed)
Presenting complaint;  Followup for PBC.   Subjective:  Patient is 67 year old Caucasian female who is here for yearly visit for followup of her PBC. She had liver biopsy in September 2012 revealing stage II or perhaps stage 1.5 disease. She feels fine. She has gained 19 pounds since her last visit. She states her weight is now close to her baseline weight which she had prior to her diagnosis of breast carcinoma. She she has stopped going to the gym but she is trying to get back into her routine. She has very good appetite. She denies melena rectal bleeding diarrhea or constipation. She is having more heartburn than in the past. She is taking ranitidine on an as-needed basis. She also complains of rash across her waistline and pruritus.  Current Medications: Current Outpatient Prescriptions  Medication Sig Dispense Refill  . B Complex Vitamins (VITAMIN-B COMPLEX PO) Take by mouth daily.      . cetirizine (ZYRTEC) 10 MG chewable tablet Chew 10 mg by mouth as needed.       . cycloSPORINE (RESTASIS) 0.05 % ophthalmic emulsion 1 drop 2 (two) times daily.        . diphenhydrAMINE (BENADRYL) 25 MG tablet Take 25 mg by mouth daily as needed. Patient takes 1-2 at Night      . Flaxseed, Linseed, OIL Take 1 capsule by mouth. Patient states that she takes this 3 times a week      . magnesium oxide (MAG-OX) 400 MG tablet Take 400 mg by mouth 2 (two) times daily.       . multivitamin-iron-minerals-folic acid (CENTRUM) chewable tablet Chew 1 tablet by mouth daily.        . naproxen (NAPROSYN) 500 MG tablet Take 500 mg by mouth 2 (two) times daily as needed.       . polyethylene glycol (MIRALAX / GLYCOLAX) packet Take 17 g by mouth as needed.       . ranitidine (ZANTAC) 150 MG capsule Take 150 mg by mouth 2 (two) times daily as needed.       . sodium chloride (OCEAN) 0.65 % nasal spray Place 1 spray into the nose as needed.        . ursodiol (ACTIGALL) 250 MG tablet Take 2 tablets (500 mg total) by mouth 3  (three) times daily.  90 tablet  3   No current facility-administered medications for this visit.     Objective: Blood pressure 110/70, pulse 72, temperature 97.4 F (36.3 C), temperature source Oral, resp. rate 16, height 5\' 6"  (1.676 m), weight 191 lb 1.6 oz (86.682 kg). Patient is alert and in no acute distress. Conjunctiva is pink. Sclera is nonicteric Oropharyngeal mucosa is normal. No neck masses or thyromegaly noted. Cardiac exam with regular rhythm normal S1 and S2. No murmur or gallop noted. Lungs are clear to auscultation. Abdomen is symmetrical. There is a faint erythematous scaly rash around the waistline. Soft abdomen without tenderness or hepatosplenomegaly.  No LE edema or clubbing noted.    Assessment:  #1. Primary biliary cirrhosis. She underwent liver biopsy in September 2012 revealing no more than stage II disease. She remains in biochemical remission. She is due for blood work. She has no stigmata of liver disease. She needs to get back into her exercise routine to reduce risk of fatty liver. #2. Skin rash on the waistline possibly contact dermatitis. #3.GERD. Symptoms not controlled with when necessary use of ranitidine.    Plan:  Patient will go to the lab for  CBC with differential and. comprehensive chemistry panel. New prescription for Urso 500 mg 3 times a day given for one month with 11 refills. Take ranitidine 150 mg by mouth twice a day. If heart tones not well controlled with H2B will consider switching to PPI. Lidex cream 0.05% to be applied to area with skin rash twice a day until rash resolved. If rash does not go there within 2 weeks she will need to followup with primary care physician. Office visit in one year.

## 2012-12-06 LAB — COMPREHENSIVE METABOLIC PANEL
ALT: 15 U/L (ref 0–35)
CO2: 26 mEq/L (ref 19–32)
Calcium: 9 mg/dL (ref 8.4–10.5)
Chloride: 105 mEq/L (ref 96–112)
Glucose, Bld: 82 mg/dL (ref 70–99)
Sodium: 137 mEq/L (ref 135–145)
Total Protein: 6.4 g/dL (ref 6.0–8.3)

## 2012-12-06 LAB — MAGNESIUM: Magnesium: 2.2 mg/dL (ref 1.5–2.5)

## 2013-02-15 ENCOUNTER — Other Ambulatory Visit: Payer: Self-pay

## 2013-02-19 ENCOUNTER — Other Ambulatory Visit (HOSPITAL_COMMUNITY): Payer: Self-pay | Admitting: Internal Medicine

## 2013-02-19 DIAGNOSIS — Z853 Personal history of malignant neoplasm of breast: Secondary | ICD-10-CM

## 2013-03-19 ENCOUNTER — Other Ambulatory Visit: Payer: Self-pay | Admitting: Obstetrics and Gynecology

## 2013-03-19 DIAGNOSIS — Z853 Personal history of malignant neoplasm of breast: Secondary | ICD-10-CM

## 2013-03-20 ENCOUNTER — Ambulatory Visit
Admission: RE | Admit: 2013-03-20 | Discharge: 2013-03-20 | Disposition: A | Discharge status: Home / Self Care | Payer: Medicare Other | Source: Ambulatory Visit | Attending: Internal Medicine | Admitting: Internal Medicine

## 2013-03-20 DIAGNOSIS — Z853 Personal history of malignant neoplasm of breast: Secondary | ICD-10-CM

## 2013-04-20 ENCOUNTER — Telehealth (INDEPENDENT_AMBULATORY_CARE_PROVIDER_SITE_OTHER): Payer: Self-pay | Admitting: *Deleted

## 2013-04-20 NOTE — Telephone Encounter (Signed)
This was called to [facility]. Ursidol 250 mg tablet take 2 by mouth 3 times daily. 3 month supply with 3 refills.

## 2013-04-20 NOTE — Telephone Encounter (Signed)
[name]'s insurance and pharmacy has changed and will need a new Rx for Ursodiol sent to [facility] phone [phone]. [name] takes 6 tablets daily and will need a 3 month supply.

## 2013-05-12 ENCOUNTER — Encounter (INDEPENDENT_AMBULATORY_CARE_PROVIDER_SITE_OTHER): Payer: Self-pay | Admitting: Internal Medicine

## 2013-09-04 ENCOUNTER — Encounter (INDEPENDENT_AMBULATORY_CARE_PROVIDER_SITE_OTHER): Payer: Self-pay | Admitting: *Deleted

## 2013-10-11 ENCOUNTER — Other Ambulatory Visit: Payer: Self-pay | Admitting: Internal Medicine

## 2013-10-11 DIAGNOSIS — Z853 Personal history of malignant neoplasm of breast: Secondary | ICD-10-CM

## 2013-12-04 ENCOUNTER — Encounter (INDEPENDENT_AMBULATORY_CARE_PROVIDER_SITE_OTHER): Payer: Self-pay | Admitting: Internal Medicine

## 2013-12-04 DIAGNOSIS — K743 Primary biliary cirrhosis: Secondary | ICD-10-CM

## 2013-12-05 LAB — CBC WITH DIFFERENTIAL/PLATELET
BASOS PCT: 0 % (ref 0–1)
Basophils Absolute: 0 10*3/uL (ref 0.0–0.1)
Eosinophils Absolute: 0 10*3/uL (ref 0.0–0.7)
Eosinophils Relative: 1 % (ref 0–5)
HCT: 36.3 % (ref 36.0–46.0)
HEMOGLOBIN: 12.6 g/dL (ref 12.0–15.0)
LYMPHS ABS: 0.7 10*3/uL (ref 0.7–4.0)
Lymphocytes Relative: 15 % (ref 12–46)
MCH: 31.3 pg (ref 26.0–34.0)
MCHC: 34.7 g/dL (ref 30.0–36.0)
MCV: 90.1 fL (ref 78.0–100.0)
Monocytes Absolute: 0.4 10*3/uL (ref 0.1–1.0)
Monocytes Relative: 8 % (ref 3–12)
NEUTROS PCT: 76 % (ref 43–77)
Neutro Abs: 3.5 10*3/uL (ref 1.7–7.7)
Platelets: 211 10*3/uL (ref 150–400)
RBC: 4.03 MIL/uL (ref 3.87–5.11)
RDW: 13.5 % (ref 11.5–15.5)
WBC: 4.6 10*3/uL (ref 4.0–10.5)

## 2013-12-10 ENCOUNTER — Encounter (INDEPENDENT_AMBULATORY_CARE_PROVIDER_SITE_OTHER): Payer: Self-pay | Admitting: Internal Medicine

## 2013-12-10 ENCOUNTER — Ambulatory Visit (INDEPENDENT_AMBULATORY_CARE_PROVIDER_SITE_OTHER): Payer: Medicare Other | Admitting: Internal Medicine

## 2013-12-10 ENCOUNTER — Telehealth (INDEPENDENT_AMBULATORY_CARE_PROVIDER_SITE_OTHER): Payer: Self-pay | Admitting: *Deleted

## 2013-12-10 VITALS — BP 112/78 | HR 76 | Temp 97.9°F | Ht 66.5 in | Wt 191.0 lb

## 2013-12-10 DIAGNOSIS — K743 Primary biliary cirrhosis: Secondary | ICD-10-CM

## 2013-12-10 DIAGNOSIS — K745 Biliary cirrhosis, unspecified: Secondary | ICD-10-CM

## 2013-12-10 NOTE — Patient Instructions (Signed)
OV in 1 yr with a CBC and CMET

## 2013-12-10 NOTE — Telephone Encounter (Signed)
.  Per Lelon Perla patient to have labs done in 1 year.

## 2013-12-10 NOTE — Progress Notes (Signed)
Subjective:    Patient ID: Kaitlyn Freeman, female    DOB: [birth year], 68 y.o.   MRN: [id number]   HPI Here today for f/u of her PBS. She was last seen in August of 2014 by Dr. Laural Golden. 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  Her weight has remained the same.  Prior hx of breast cancer in 2011. Underwent chemo and radiation.  She is not exercising now however she says she is going to start back.  Her acid reflux is controlled. She takes Zantac on a prn basis.  She usually has a BM x 1 day. No melena or BRRB. There is no rash. No abdominal pain.        CMP     Component Value Date/Time   NA 137 12/05/2012 1747   NA 137 09/28/2010   K 4.2 12/05/2012 1747   CL 105 12/05/2012 1747   CO2 26 12/05/2012 1747   GLUCOSE 82 12/05/2012 1747   BUN 17 12/05/2012 1747   CREATININE 1.09 12/05/2012 1747   CREATININE 1.0 09/28/2010   CALCIUM 9.0 12/05/2012 1747   PROT 6.4 12/05/2012 1747   ALBUMIN 4.0 12/05/2012 1747   AST 20 12/05/2012 1747   ALT 15 12/05/2012 1747   ALKPHOS 90 12/05/2012 1747   BILITOT 0.5 12/05/2012 1747    CBC    Component Value Date/Time   WBC 4.6 12/04/2013 1259   RBC 4.03 12/04/2013 1259   HGB 12.6 12/04/2013 1259   HCT 36.3 12/04/2013 1259   PLT 211 12/04/2013 1259   MCV 90.1 12/04/2013 1259   MCH 31.3 12/04/2013 1259   MCHC 34.7 12/04/2013 1259   RDW 13.5 12/04/2013 1259   LYMPHSABS 0.7 12/04/2013 1259   MONOABS 0.4 12/04/2013 1259   EOSABS 0.0 12/04/2013 1259   BASOSABS 0.0 12/04/2013 1259        Review of Systems Past Medical History  Diagnosis Date  . Arthritis     right knee  . Hiatal hernia   . Family history of breast cancer   . Fatty liver   . 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 Breast cancer   . GERD (gastroesophageal reflux disease)   . Breast cancer     Past Surgical History  Procedure Laterality Date  . Laparoscopy  1977  . Breast surgery    . Breast lumpectomy    . Upper gastrointestinal endoscopy   1996  . Colonoscopy      Patient feels that it is getting to be time for Colnoscopy    Allergies  Allergen Reactions  . Sporanox [Itraconazole]     Confirm with patient type of reaction.    Current Outpatient Prescriptions on File Prior to Visit  Medication Sig Dispense Refill  . B Complex Vitamins (VITAMIN-B COMPLEX PO) Take by mouth daily.      . cetirizine (ZYRTEC) 10 MG chewable tablet Chew 10 mg by mouth as needed.       . cycloSPORINE (RESTASIS) 0.05 % ophthalmic emulsion 1 drop as needed.       . diphenhydrAMINE (BENADRYL) 25 MG tablet Take 25 mg by mouth daily as needed. Patient takes 1-2 at Night      . magnesium oxide (MAG-OX) 400 MG tablet Take 400 mg by mouth 2 (two) times daily.       . Multiple Vitamin (MULTIVITAMIN WITH MINERALS) TABS tablet Take 1 tablet by mouth.      Marland Kitchen  naproxen (NAPROSYN) 500 MG tablet Take 500 mg by mouth 2 (two) times daily as needed.       . polyethylene glycol (MIRALAX / GLYCOLAX) packet Take 17 g by mouth as needed.       . ranitidine (ZANTAC) 150 MG capsule Take 1 capsule (150 mg total) by mouth 2 (two) times daily.  60 capsule  11   No current facility-administered medications on file prior to visit.        Objective:   Physical Exam  Filed Vitals:   12/10/13 1543  BP: 112/78  Pulse: 76  Temp: 97.9 F (36.6 C)  Height: 5' 6.5" (1.689 m)  Weight: 191 lb (86.637 kg)    Alert and oriented. Skin warm and dry. Oral mucosa is moist.   . Sclera anicteric, conjunctivae is pink. Thyroid not enlarged. No cervical lymphadenopathy. Lungs clear. Heart regular rate and rhythm.  Abdomen is soft. Bowel sounds are positive. No hepatomegaly. No abdominal masses felt. No tenderness.  No edema to lower extremities.         Assessment & Plan:  PBC. She seems to be doing well. Liver enzymes are normal.  She tells me today she has an appt at [facility] to see Dr. Alessandra Grout for a second opinion and I told her that was okay. Needs Surveillance US abdomen in 3  yrs. I discussed with Dr. Laural Golden.  Patient also request copies of old records.

## 2014-03-21 ENCOUNTER — Ambulatory Visit
Admission: RE | Admit: 2014-03-21 | Discharge: 2014-03-21 | Disposition: A | Discharge status: Home / Self Care | Payer: Medicare Other | Source: Ambulatory Visit | Attending: Internal Medicine | Admitting: Internal Medicine

## 2014-03-21 ENCOUNTER — Other Ambulatory Visit: Payer: Self-pay | Admitting: Internal Medicine

## 2014-03-21 DIAGNOSIS — Z9889 Other specified postprocedural states: Secondary | ICD-10-CM

## 2014-03-21 DIAGNOSIS — Z853 Personal history of malignant neoplasm of breast: Secondary | ICD-10-CM

## 2014-04-25 ENCOUNTER — Encounter (INDEPENDENT_AMBULATORY_CARE_PROVIDER_SITE_OTHER): Payer: Self-pay | Admitting: Internal Medicine

## 2014-04-25 ENCOUNTER — Other Ambulatory Visit (INDEPENDENT_AMBULATORY_CARE_PROVIDER_SITE_OTHER): Payer: Self-pay | Admitting: Internal Medicine

## 2014-04-25 NOTE — Telephone Encounter (Signed)
rx sent

## 2014-05-01 MED ORDER — URSODIOL 250 MG PO TABS: 250.0000 mg | ORAL_TABLET | Freq: Three times a day (TID) | ORAL | Status: DC

## 2014-05-01 NOTE — Telephone Encounter (Signed)
Rx sent to her pharmacy 

## 2014-05-06 ENCOUNTER — Telehealth (INDEPENDENT_AMBULATORY_CARE_PROVIDER_SITE_OTHER): Payer: Self-pay | Admitting: *Deleted

## 2014-05-06 NOTE — Telephone Encounter (Signed)
Would like for someone to call her please   [phone]

## 2014-05-06 NOTE — Telephone Encounter (Signed)
[facility] is needing a clarification on the quantity for Ursodiol. Tammy is calling with this information.

## 2014-05-15 ENCOUNTER — Telehealth (INDEPENDENT_AMBULATORY_CARE_PROVIDER_SITE_OTHER): Payer: Self-pay | Admitting: *Deleted

## 2014-05-15 NOTE — Telephone Encounter (Signed)
Patient called office, she states that her pharmacy had not heard from [place] on the Ursodiol. It was e-scribed on 05/01/14 by Terri. Pharmacy says they called yesterday and spoke to someone who told them that we did not do it. They removed the prescription per the Pharmacist. A verbal order was given as follows: Ursodiol 250 mg  - Take 2 tablets by mouth 3 times daily - Dispense 180 - 11 refills The Pharmacist states they will have to contact the patient as they have no new insurance or updated insurance information on her. Secondly , he states that they do not have the medication in stock and will have to order it. They will keep [place] informed and they are going to notify the patient.

## 2014-05-16 ENCOUNTER — Other Ambulatory Visit (INDEPENDENT_AMBULATORY_CARE_PROVIDER_SITE_OTHER): Payer: Self-pay | Admitting: Internal Medicine

## 2014-05-16 MED ORDER — URSODIOL 250 MG PO TABS: 250.0000 mg | ORAL_TABLET | Freq: Three times a day (TID) | ORAL | Status: AC

## 2014-05-16 NOTE — Telephone Encounter (Signed)
The pharmacy didn't call our office. Tammy called the pharmacy when we received the fax or clarification on the quality.

## 2014-08-27 ENCOUNTER — Encounter (INDEPENDENT_AMBULATORY_CARE_PROVIDER_SITE_OTHER): Payer: Self-pay | Admitting: *Deleted

## 2014-11-06 ENCOUNTER — Encounter (INDEPENDENT_AMBULATORY_CARE_PROVIDER_SITE_OTHER): Payer: Self-pay | Admitting: *Deleted

## 2014-11-06 ENCOUNTER — Other Ambulatory Visit (INDEPENDENT_AMBULATORY_CARE_PROVIDER_SITE_OTHER): Payer: Self-pay | Admitting: *Deleted

## 2014-11-06 DIAGNOSIS — K743 Primary biliary cirrhosis: Secondary | ICD-10-CM

## 2014-12-11 ENCOUNTER — Ambulatory Visit (INDEPENDENT_AMBULATORY_CARE_PROVIDER_SITE_OTHER): Payer: Medicare Other | Admitting: Internal Medicine

## 2015-03-18 ENCOUNTER — Other Ambulatory Visit: Payer: Self-pay | Admitting: Internal Medicine

## 2015-03-18 DIAGNOSIS — Z853 Personal history of malignant neoplasm of breast: Secondary | ICD-10-CM

## 2015-03-31 ENCOUNTER — Ambulatory Visit
Admission: RE | Admit: 2015-03-31 | Discharge: 2015-03-31 | Disposition: A | Discharge status: Home / Self Care | Payer: Medicare Other | Source: Ambulatory Visit | Attending: Internal Medicine | Admitting: Internal Medicine

## 2015-03-31 ENCOUNTER — Other Ambulatory Visit: Payer: Self-pay | Admitting: Internal Medicine

## 2015-03-31 DIAGNOSIS — Z853 Personal history of malignant neoplasm of breast: Secondary | ICD-10-CM

## 2015-12-18 ENCOUNTER — Other Ambulatory Visit: Payer: Self-pay | Admitting: Obstetrics and Gynecology

## 2015-12-18 DIAGNOSIS — Z853 Personal history of malignant neoplasm of breast: Secondary | ICD-10-CM

## 2016-03-31 ENCOUNTER — Ambulatory Visit
Admission: RE | Admit: 2016-03-31 | Discharge: 2016-03-31 | Disposition: A | Discharge status: Home / Self Care | Payer: Medicare Other | Source: Ambulatory Visit | Attending: Obstetrics and Gynecology | Admitting: Obstetrics and Gynecology

## 2016-03-31 DIAGNOSIS — Z853 Personal history of malignant neoplasm of breast: Secondary | ICD-10-CM

## 2016-12-01 ENCOUNTER — Other Ambulatory Visit: Payer: Self-pay | Admitting: Obstetrics and Gynecology

## 2016-12-01 DIAGNOSIS — Z9889 Other specified postprocedural states: Secondary | ICD-10-CM

## 2017-04-06 ENCOUNTER — Ambulatory Visit
Admission: RE | Admit: 2017-04-06 | Discharge: 2017-04-06 | Disposition: A | Discharge status: Home / Self Care | Payer: Medicare Other | Source: Ambulatory Visit | Attending: Obstetrics and Gynecology | Admitting: Obstetrics and Gynecology

## 2017-04-06 DIAGNOSIS — Z9889 Other specified postprocedural states: Secondary | ICD-10-CM

## 2017-04-06 HISTORY — DX: Personal history of irradiation: Z92.3

## 2017-04-06 HISTORY — DX: Personal history of antineoplastic chemotherapy: Z92.21

## 2018-04-10 ENCOUNTER — Other Ambulatory Visit: Payer: Self-pay | Admitting: Obstetrics and Gynecology

## 2018-04-10 DIAGNOSIS — Z1231 Encounter for screening mammogram for malignant neoplasm of breast: Secondary | ICD-10-CM

## 2018-04-11 ENCOUNTER — Ambulatory Visit
Admission: RE | Admit: 2018-04-11 | Discharge: 2018-04-11 | Disposition: A | Discharge status: Home / Self Care | Payer: Medicare PPO | Source: Ambulatory Visit | Attending: Obstetrics and Gynecology | Admitting: Obstetrics and Gynecology

## 2018-04-11 DIAGNOSIS — Z1231 Encounter for screening mammogram for malignant neoplasm of breast: Secondary | ICD-10-CM

## 2019-07-12 DEATH — deceased

## 2019-11-07 IMAGING — MG DIGITAL SCREENING BILATERAL MAMMOGRAM WITH TOMO AND CAD
8 series · 9 of 24 positions shown · non-contrast
Comparison: Previous exam(s).

CLINICAL DATA: Screening. History of LEFT breast cancer and
lumpectomy in 4055.

EXAM:
DIGITAL SCREENING BILATERAL MAMMOGRAM WITH TOMO AND CAD

[R CC synth-2D]
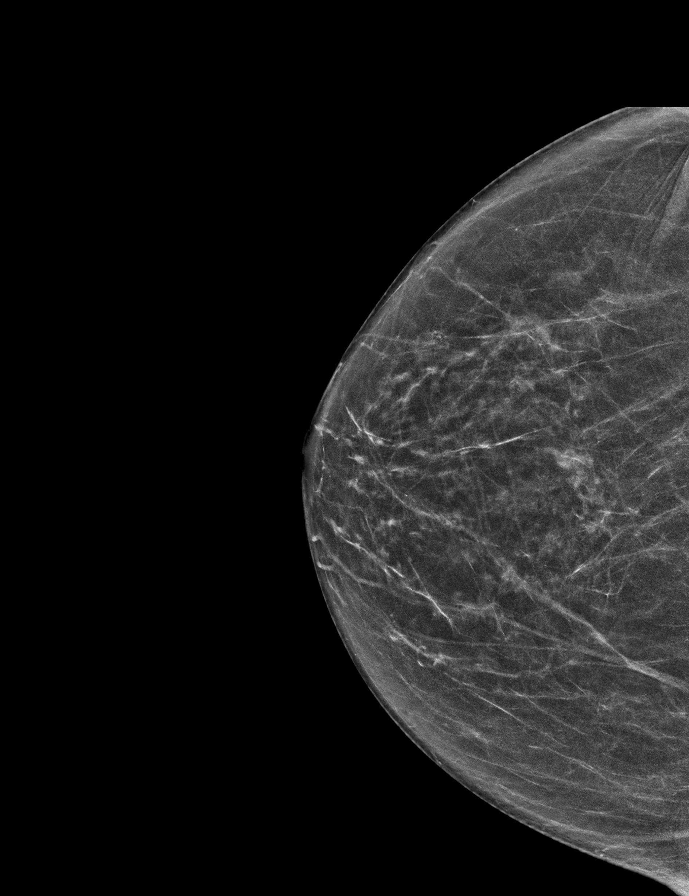

[L CC synth-2D]
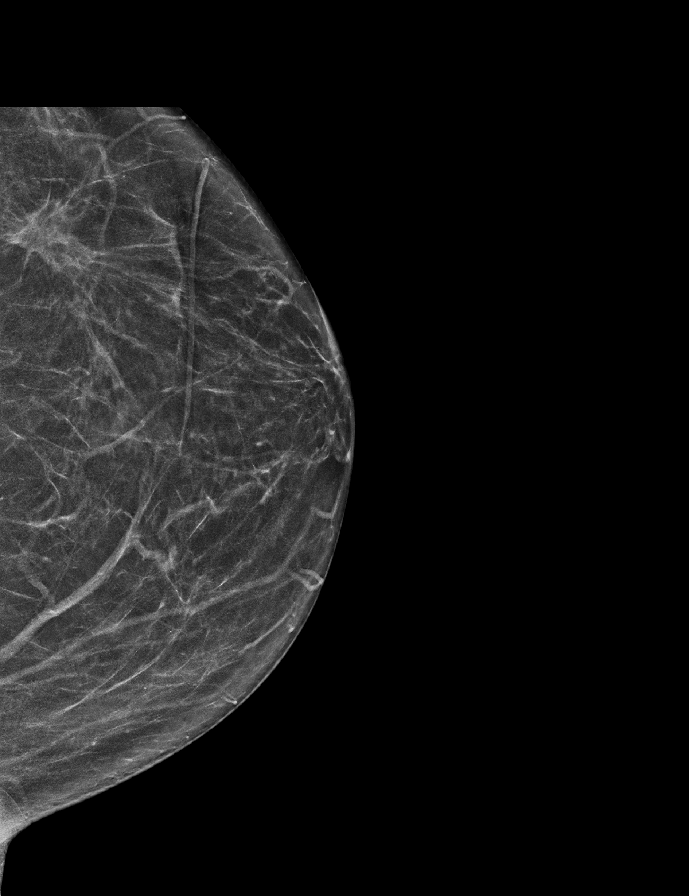

[L MLO synth-2D]
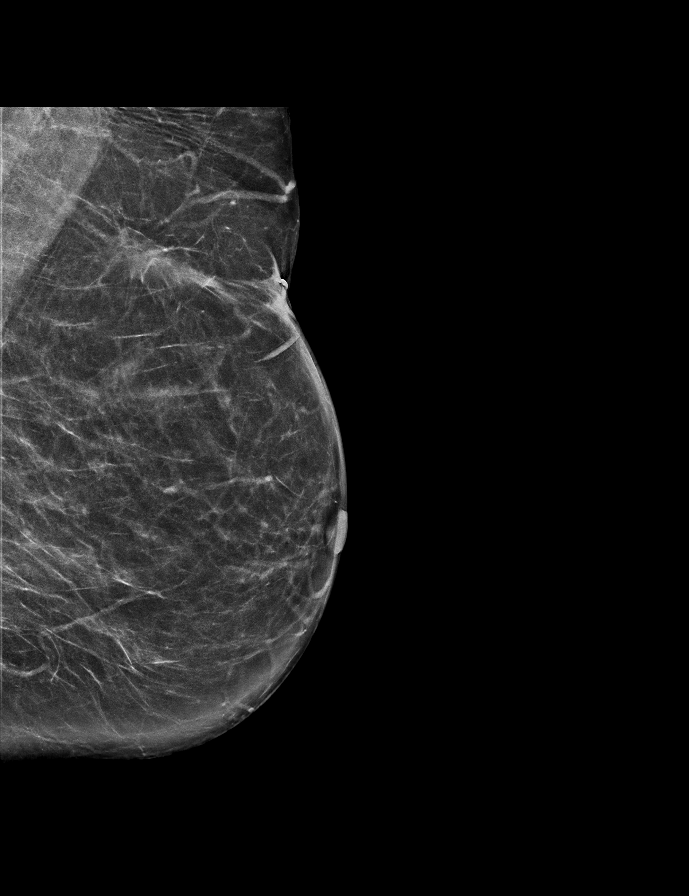

[R MLO synth-2D]
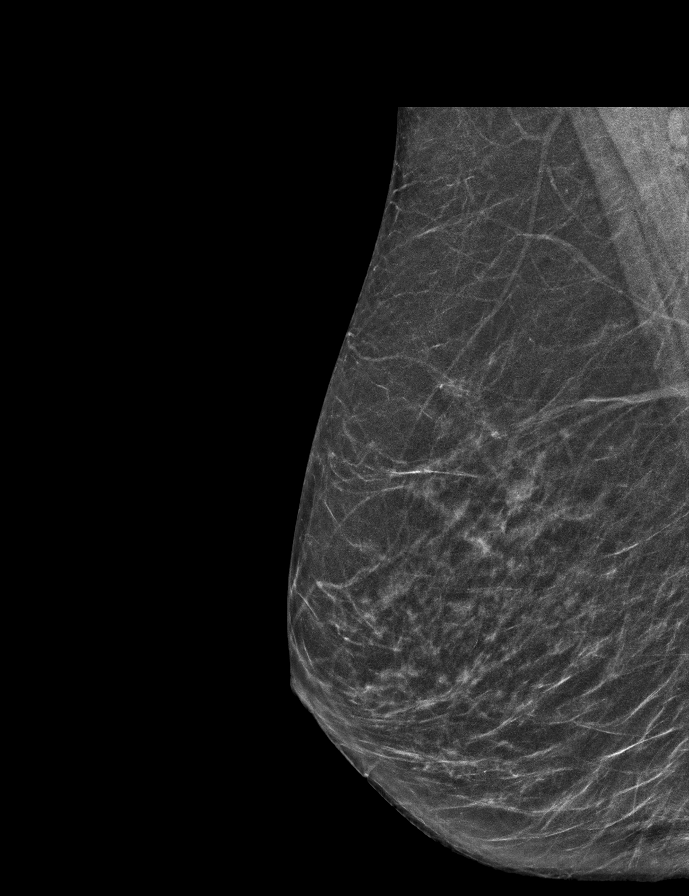

[L MLO tomo · 2 of 64 frames shown]
[frame 21/64]
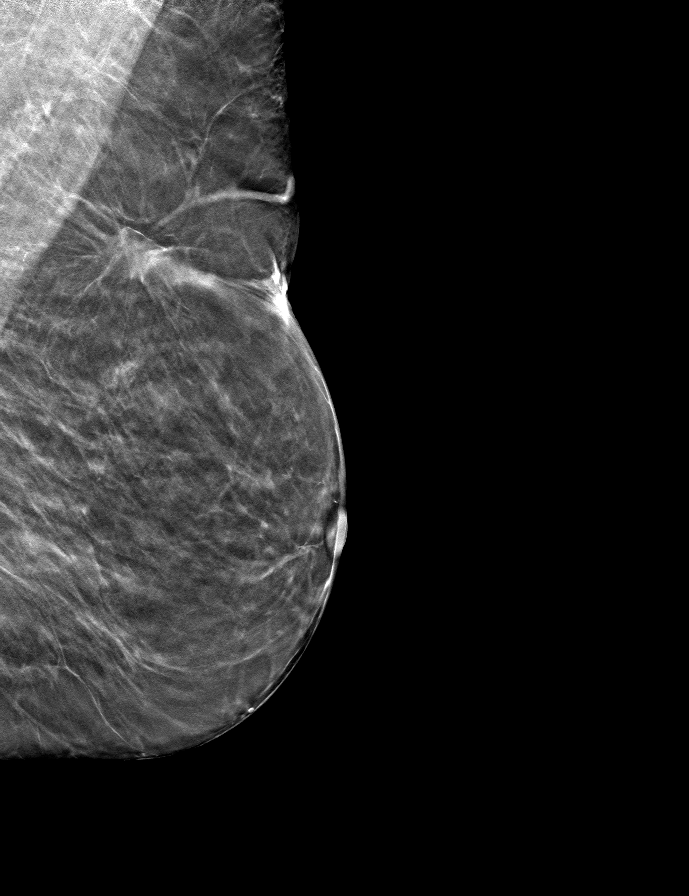
[frame 33/64]
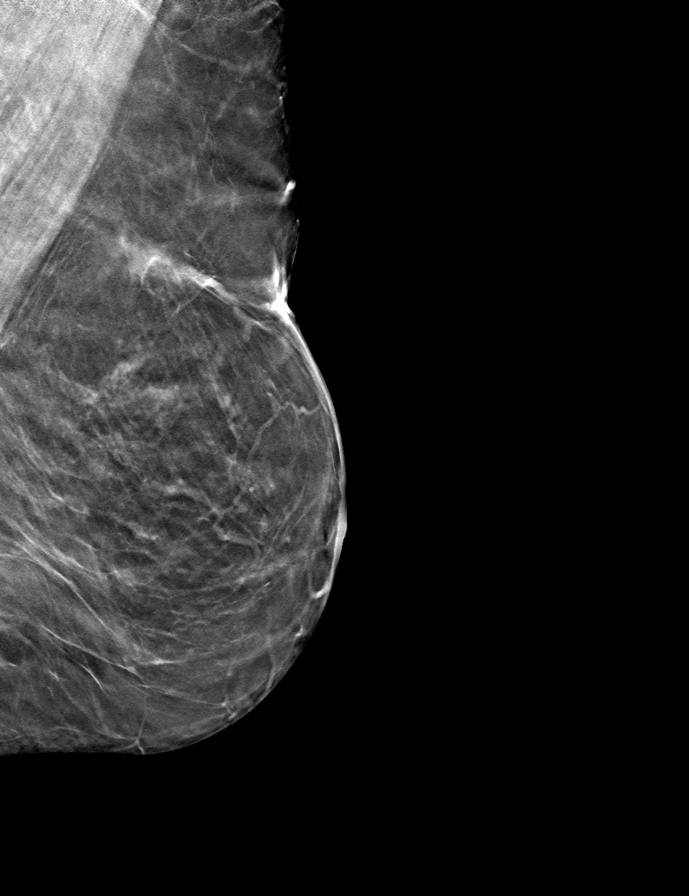

[L CC tomo · tomo slice 30/59.0]
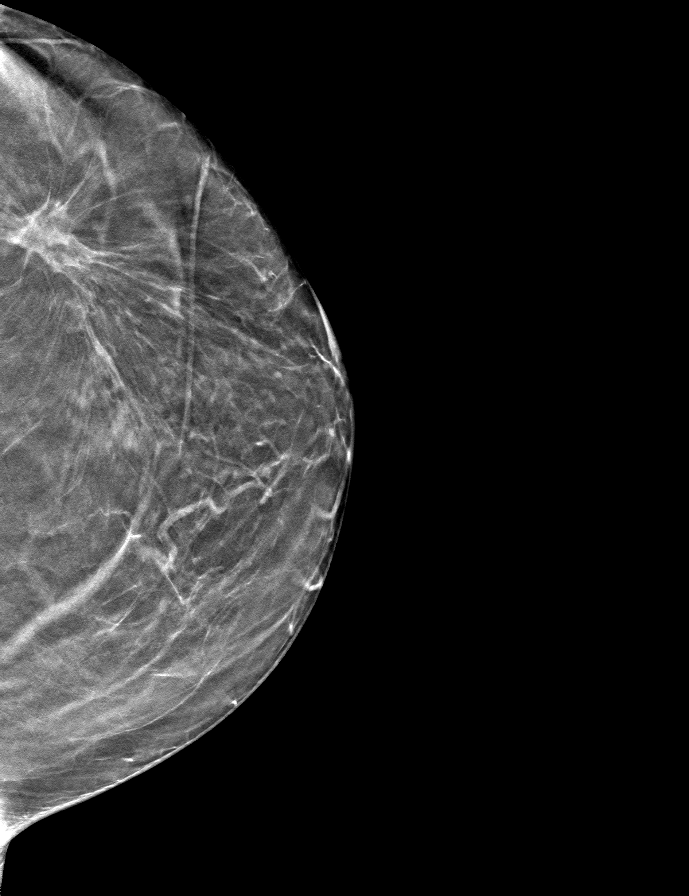

[R MLO tomo · tomo slice 27/54.0]
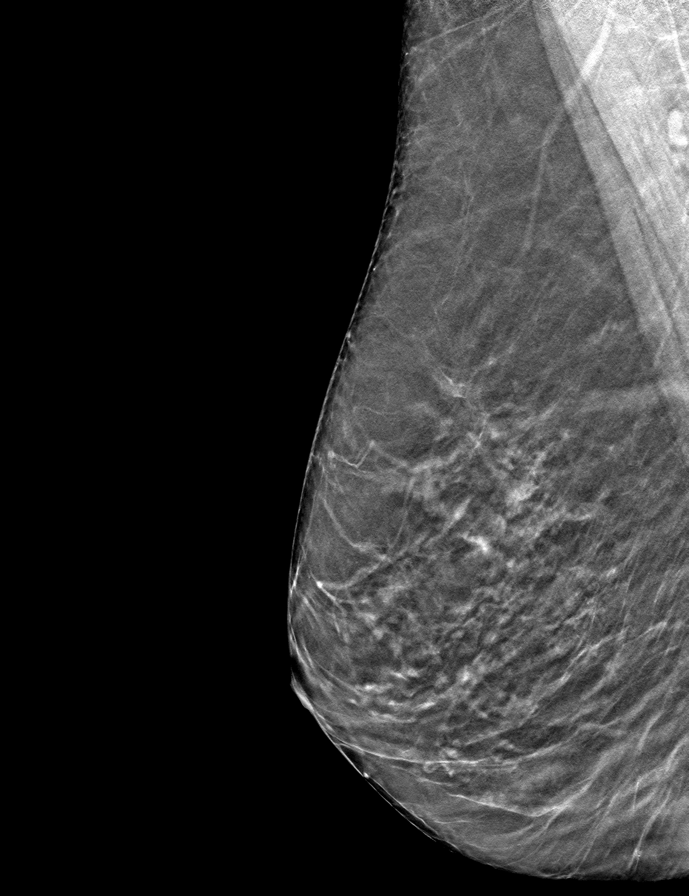

[R CC tomo · tomo slice 28/55.0]
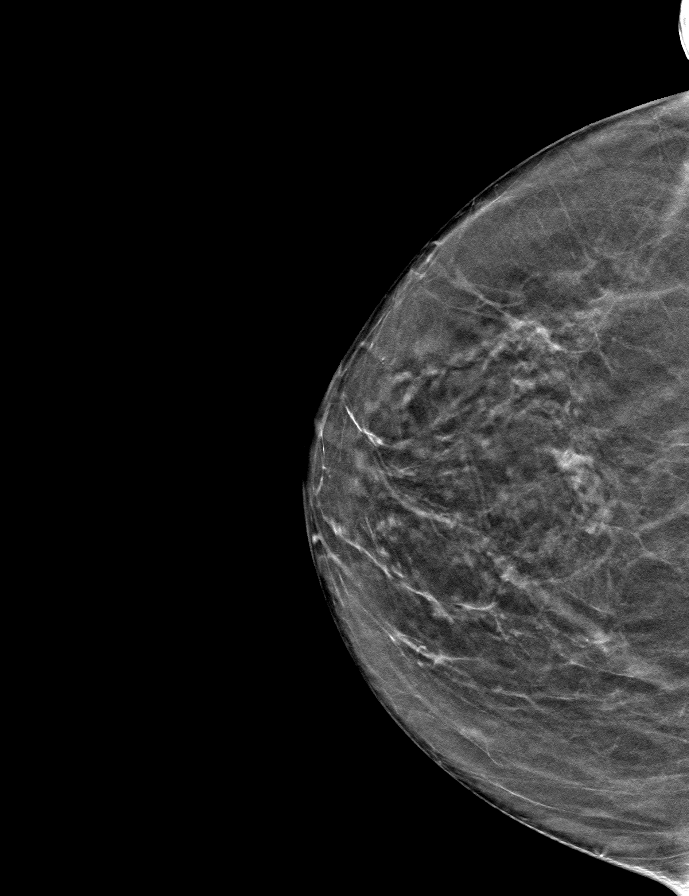

[9 of 24 positions shown; findings below may reference images not displayed]

ACR Breast Density Category b: There are scattered areas of
fibroglandular density.
FINDINGS: There are no findings suspicious for malignancy.

LEFT lumpectomy changes are again noted.

Images were processed with CAD.
IMPRESSION: No mammographic evidence of malignancy. A result letter of this
screening mammogram will be mailed directly to the patient.

RECOMMENDATION:
Screening mammogram in one year. (Code:AP-4-U2E)

BI-RADS CATEGORY  2: Benign.

## 2022-02-21 ENCOUNTER — Encounter (INDEPENDENT_AMBULATORY_CARE_PROVIDER_SITE_OTHER): Payer: Self-pay | Admitting: Gastroenterology
